# Patient Record
Sex: Male | Born: 2000
Health system: Southern US, Community
[De-identification: ages and names within clinical notes are randomized; demographics above are authoritative.]

---
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
## 2019-03-19 ENCOUNTER — Telehealth: Payer: Self-pay | Admitting: Podiatry

## 2019-03-19 DIAGNOSIS — Z01812 Encounter for preprocedural laboratory examination: Secondary | ICD-10-CM

## 2019-03-19 NOTE — Telephone Encounter (Signed)
Calling to get pathology results from February. Requested a call back by end of day as she stated she has left several messages.

## 2019-03-19 NOTE — Addendum Note (Signed)
Addended by: Alphia Kava D on: 03/19/2019 12:29 PM   Modules accepted: Orders

## 2019-03-19 NOTE — Telephone Encounter (Signed)
Dr. Stacie Acres states the fungal culture is positive and if would like to take Lamisil for 90 days needs blood work prior, then may have e-visit with Dr. Stacie Acres. Pt's mtr, Johnny Bridge states will take the lamisil and have the blood work.

## 2019-03-20 DIAGNOSIS — Z01812 Encounter for preprocedural laboratory examination: Secondary | ICD-10-CM | POA: Diagnosis not present

## 2019-03-21 LAB — CBC WITH DIFFERENTIAL/PLATELET
Absolute Monocytes: 312 cells/uL (ref 200–900)
Basophils Absolute: 31 cells/uL (ref 0–200)
Basophils Relative: 0.8 %
Eosinophils Absolute: 140 cells/uL (ref 15–500)
Eosinophils Relative: 3.6 %
HCT: 44.3 % (ref 36.0–49.0)
Hemoglobin: 15 g/dL (ref 12.0–16.9)
Lymphs Abs: 1673 cells/uL (ref 1200–5200)
MCH: 28.7 pg (ref 25.0–35.0)
MCHC: 33.9 g/dL (ref 31.0–36.0)
MCV: 84.9 fL (ref 78.0–98.0)
MPV: 13.1 fL — ABNORMAL HIGH (ref 7.5–12.5)
Monocytes Relative: 8 %
Neutro Abs: 1743 cells/uL — ABNORMAL LOW (ref 1800–8000)
Neutrophils Relative %: 44.7 %
Platelets: 137 10*3/uL — ABNORMAL LOW (ref 140–400)
RBC: 5.22 10*6/uL (ref 4.10–5.70)
RDW: 13.1 % (ref 11.0–15.0)
Total Lymphocyte: 42.9 %
WBC: 3.9 10*3/uL — ABNORMAL LOW (ref 4.5–13.0)

## 2019-03-21 LAB — HEPATIC FUNCTION PANEL
AG Ratio: 1.8 (calc) (ref 1.0–2.5)
ALT: 12 U/L (ref 8–46)
AST: 19 U/L (ref 12–32)
Albumin: 4.6 g/dL (ref 3.6–5.1)
Alkaline phosphatase (APISO): 60 U/L (ref 46–169)
Bilirubin, Direct: 0.4 mg/dL — ABNORMAL HIGH (ref 0.0–0.2)
Globulin: 2.5 g/dL (calc) (ref 2.1–3.5)
Indirect Bilirubin: 1.4 mg/dL (calc) — ABNORMAL HIGH (ref 0.2–1.1)
Total Bilirubin: 1.8 mg/dL — ABNORMAL HIGH (ref 0.2–1.1)
Total Protein: 7.1 g/dL (ref 6.3–8.2)

## 2019-03-22 ENCOUNTER — Telehealth: Payer: Self-pay | Admitting: *Deleted

## 2019-03-22 NOTE — Telephone Encounter (Signed)
Faxed pt's 03/19/2019 CBC with Diff and Hepatic Function results to [facility] - Dr. Dario Guardian.

## 2019-03-22 NOTE — Telephone Encounter (Signed)
Dr. Stacie Acres states pt's WBC are low and lamisil can also decrease, will not prescribe lamisil oral, may use [facility] antifungal nail cream and send results to PCP. I informed pt's mtr, Johnny Bridge and she states understanding and have pt use the topical antifungal. Johnny Bridge states pt's doctor is Dr. Festus Barren [facility].

## 2019-03-26 ENCOUNTER — Telehealth: Payer: Self-pay | Admitting: Podiatry

## 2019-03-26 NOTE — Telephone Encounter (Signed)
Received verbal request from Dr. Dario Guardian assistant, Willaim Sheng for clinicals concerning pt's lab results. Faxed clinicals to [facility] Attn:  Willaim Sheng.

## 2019-03-27 ENCOUNTER — Telehealth: Payer: 59 | Admitting: Podiatry

## 2019-03-27 NOTE — Telephone Encounter (Signed)
I called [facility] and she states she has the labs and medical records.

## 2019-03-27 NOTE — Telephone Encounter (Signed)
Dean Burke with [facility] has requested all medical records, labs, and why labs were done and why pt was seen here. If you have questions, you can reach me at [phone].

## 2019-04-02 DIAGNOSIS — Z0000 Encounter for general adult medical examination without abnormal findings: Secondary | ICD-10-CM | POA: Diagnosis not present

## 2019-06-21 ENCOUNTER — Telehealth: Payer: Self-pay | Admitting: Podiatry

## 2019-06-21 MED ORDER — NONFORMULARY OR COMPOUNDED ITEM: 11 refills | Status: AC

## 2019-06-21 NOTE — Addendum Note (Signed)
Addended by: Harriett Sine D on: 06/21/2019 02:07 PM   Modules accepted: Orders

## 2019-06-21 NOTE — Telephone Encounter (Signed)
Pt  Mom has never received anti-fungal cream. She had called [employer] and she has not received a call back. Pt is concerned about her son fungus, it has not been treated

## 2019-06-21 NOTE — Telephone Encounter (Signed)
Faxed orders for [facility] antifungal cream.

## 2019-06-21 NOTE — Telephone Encounter (Signed)
I informed pt's mtr, Jana Half of the orders to [employer] and apologized for the delay and I would place a [employer].

## 2022-12-01 ENCOUNTER — Encounter: Payer: Self-pay | Admitting: Podiatry

## 2022-12-01 ENCOUNTER — Ambulatory Visit (INDEPENDENT_AMBULATORY_CARE_PROVIDER_SITE_OTHER): Payer: No Typology Code available for payment source | Admitting: Podiatry

## 2022-12-01 VITALS — BP 133/75 | HR 85

## 2022-12-01 DIAGNOSIS — B351 Tinea unguium: Secondary | ICD-10-CM | POA: Diagnosis not present

## 2022-12-01 MED ORDER — TERBINAFINE HCL 250 MG PO TABS: 250.0000 mg | ORAL_TABLET | Freq: Every day | ORAL | 0 refills | Status: AC

## 2022-12-01 MED ORDER — TERBINAFINE HCL 250 MG PO TABS: 250.0000 mg | ORAL_TABLET | Freq: Every day | ORAL | 0 refills | Status: DC

## 2022-12-01 NOTE — Progress Notes (Signed)
  Subjective:  Patient ID: Dean Burke, male    DOB: 06-25-2001,  MRN: [id number]  Chief Complaint  Patient presents with   Nail Problem    Patient reports thick, discolored, brittle nails. He has tried topical treatment in the past but it is worse now and would like to try oral medication if possible. It was helpful for his mom.     22 y.o. male presents with the above complaint. History confirmed with patient.   Objective:  Physical Exam: warm, good capillary refill, no trophic changes or ulcerative lesions, normal DP and PT pulses, normal sensory exam, and onychomycosis.      Assessment:   1. Onychomycosis      Plan:  Patient was evaluated and treated and all questions answered.  Onychomycosis -Educated on etiology of nail fungus. -Discussed oral topical and laser therapy and risks and benefits of each -eRx for oral terbinafine #90. Educated on risks and benefits of the medication. -Photographs taken   Return in about 3 months (around 03/02/2023) for follow up after nail fungus treatment.

## 2023-03-02 ENCOUNTER — Ambulatory Visit: Payer: No Typology Code available for payment source | Admitting: Podiatry

## 2024-11-02 ENCOUNTER — Emergency Department (HOSPITAL_COMMUNITY)
Admission: EM | Admit: 2024-11-02 | Discharge: 2024-11-03 | Payer: Self-pay | Attending: Emergency Medicine | Admitting: Emergency Medicine

## 2024-11-02 DIAGNOSIS — S62624A Displaced fracture of medial phalanx of right ring finger, initial encounter for closed fracture: Secondary | ICD-10-CM | POA: Diagnosis not present

## 2024-11-02 DIAGNOSIS — M79644 Pain in right finger(s): Secondary | ICD-10-CM | POA: Diagnosis present

## 2024-11-02 DIAGNOSIS — S0081XA Abrasion of other part of head, initial encounter: Secondary | ICD-10-CM | POA: Insufficient documentation

## 2024-11-03 ENCOUNTER — Encounter (HOSPITAL_COMMUNITY): Payer: Self-pay

## 2024-11-03 ENCOUNTER — Ambulatory Visit (HOSPITAL_COMMUNITY)
Admission: EM | Admit: 2024-11-03 | Discharge: 2024-11-03 | Disposition: A | Discharge status: Home / Self Care | Attending: Family Medicine | Admitting: Family Medicine

## 2024-11-03 ENCOUNTER — Emergency Department (HOSPITAL_COMMUNITY)

## 2024-11-03 ENCOUNTER — Other Ambulatory Visit: Payer: Self-pay

## 2024-11-03 DIAGNOSIS — S62602D Fracture of unspecified phalanx of right middle finger, subsequent encounter for fracture with routine healing: Secondary | ICD-10-CM | POA: Diagnosis not present

## 2024-11-03 DIAGNOSIS — S0993XS Unspecified injury of face, sequela: Secondary | ICD-10-CM

## 2024-11-03 LAB — RAPID HIV SCREEN (HIV 1/2 AB+AG)
HIV 1/2 Antibodies: NONREACTIVE
HIV-1 P24 Antigen - HIV24: NONREACTIVE

## 2024-11-03 LAB — HEPATITIS PANEL, ACUTE
HCV Ab: NONREACTIVE
Hep A IgM: NONREACTIVE
Hep B C IgM: NONREACTIVE
Hepatitis B Surface Ag: NONREACTIVE

## 2024-11-03 MED ORDER — ACETAMINOPHEN 500 MG PO TABS
1000.0000 mg | ORAL_TABLET | Freq: Once | ORAL | Status: AC
  Administered 2024-11-03: 1000 mg via ORAL
  Filled 2024-11-03: qty 2

## 2024-11-03 MED ORDER — OXYCODONE HCL 5 MG PO TABS: 2.5000 mg | ORAL_TABLET | Freq: Four times a day (QID) | ORAL | 0 refills | Status: DC | PRN

## 2024-11-03 MED ORDER — IBUPROFEN 400 MG PO TABS: 400.0000 mg | ORAL_TABLET | Freq: Three times a day (TID) | ORAL | 0 refills | Status: AC | PRN
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
## 2024-11-03 NOTE — Discharge Instructions (Addendum)
 For pain control you may take 1000 mg of Tylenol every 8 hours scheduled.  In addition you can take 0.5 to 1 tablet of Oxycodone every 6 hours as needed for pain not controlled with the scheduled Tylenol.
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
## 2024-11-03 NOTE — Progress Notes (Signed)
 Orthopedic Tech Progress Note Patient Details:  Dean Burke 01/01/2001 [id number]  Ortho Devices Type of Ortho Device: Finger splint Ortho Device/Splint Location: rue ring finger Ortho Device/Splint Interventions: Ordered, Application, Adjustment   Post Interventions Patient Tolerated: Well Instructions Provided: Care of device, Adjustment of device  Chandra Dorn PARAS 11/03/2024, 2:52 AM
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
